# Patient Record
Sex: Male | Born: 1955 | Race: Black or African American | Hispanic: No | Marital: Single | Smoking: Current every day smoker
Health system: Southern US, Community
[De-identification: ages and names within clinical notes are randomized; demographics above are authoritative.]

## PROBLEM LIST (undated history)

## (undated) DIAGNOSIS — E119 Type 2 diabetes mellitus without complications: Secondary | ICD-10-CM

## (undated) DIAGNOSIS — I252 Old myocardial infarction: Secondary | ICD-10-CM

## (undated) HISTORY — PX: CORONARY ANGIOPLASTY WITH STENT PLACEMENT: SHX49

---

## 2016-12-01 ENCOUNTER — Encounter: Payer: Self-pay | Admitting: Emergency Medicine

## 2016-12-01 ENCOUNTER — Emergency Department
Admission: EM | Admit: 2016-12-01 | Discharge: 2016-12-03 | Disposition: A | Discharge status: Other Acute Inpatient Hospital | Payer: Self-pay | Attending: Emergency Medicine | Admitting: Emergency Medicine

## 2016-12-01 ENCOUNTER — Emergency Department
Admission: EM | Admit: 2016-12-01 | Discharge: 2016-12-01 | Disposition: A | Discharge status: Home / Self Care | Payer: Self-pay | Attending: Emergency Medicine | Admitting: Emergency Medicine

## 2016-12-01 ENCOUNTER — Emergency Department: Payer: Self-pay

## 2016-12-01 DIAGNOSIS — R0602 Shortness of breath: Secondary | ICD-10-CM | POA: Insufficient documentation

## 2016-12-01 DIAGNOSIS — Z7982 Long term (current) use of aspirin: Secondary | ICD-10-CM | POA: Insufficient documentation

## 2016-12-01 DIAGNOSIS — Z79899 Other long term (current) drug therapy: Secondary | ICD-10-CM | POA: Insufficient documentation

## 2016-12-01 DIAGNOSIS — F1410 Cocaine abuse, uncomplicated: Secondary | ICD-10-CM

## 2016-12-01 DIAGNOSIS — R079 Chest pain, unspecified: Secondary | ICD-10-CM

## 2016-12-01 DIAGNOSIS — R451 Restlessness and agitation: Secondary | ICD-10-CM | POA: Insufficient documentation

## 2016-12-01 DIAGNOSIS — F17210 Nicotine dependence, cigarettes, uncomplicated: Secondary | ICD-10-CM | POA: Insufficient documentation

## 2016-12-01 DIAGNOSIS — R45851 Suicidal ideations: Secondary | ICD-10-CM

## 2016-12-01 DIAGNOSIS — F602 Antisocial personality disorder: Secondary | ICD-10-CM

## 2016-12-01 DIAGNOSIS — I252 Old myocardial infarction: Secondary | ICD-10-CM | POA: Insufficient documentation

## 2016-12-01 DIAGNOSIS — Z765 Malingerer [conscious simulation]: Secondary | ICD-10-CM

## 2016-12-01 DIAGNOSIS — E119 Type 2 diabetes mellitus without complications: Secondary | ICD-10-CM | POA: Insufficient documentation

## 2016-12-01 DIAGNOSIS — R443 Hallucinations, unspecified: Secondary | ICD-10-CM | POA: Insufficient documentation

## 2016-12-01 DIAGNOSIS — R0789 Other chest pain: Secondary | ICD-10-CM | POA: Insufficient documentation

## 2016-12-01 DIAGNOSIS — Z7984 Long term (current) use of oral hypoglycemic drugs: Secondary | ICD-10-CM | POA: Insufficient documentation

## 2016-12-01 DIAGNOSIS — R110 Nausea: Secondary | ICD-10-CM | POA: Insufficient documentation

## 2016-12-01 HISTORY — DX: Type 2 diabetes mellitus without complications: E11.9

## 2016-12-01 HISTORY — DX: Old myocardial infarction: I25.2

## 2016-12-01 LAB — CBC
HCT: 40 % (ref 40.0–52.0)
Hemoglobin: 13.8 g/dL (ref 13.0–18.0)
MCH: 30.3 pg (ref 26.0–34.0)
MCHC: 34.5 g/dL (ref 32.0–36.0)
MCV: 87.8 fL (ref 80.0–100.0)
PLATELETS: 225 10*3/uL (ref 150–440)
RBC: 4.56 MIL/uL (ref 4.40–5.90)
RDW: 14.8 % — AB (ref 11.5–14.5)
WBC: 6.4 10*3/uL (ref 3.8–10.6)

## 2016-12-01 LAB — COMPREHENSIVE METABOLIC PANEL
ALT: 31 U/L (ref 17–63)
ANION GAP: 8 (ref 5–15)
AST: 32 U/L (ref 15–41)
Albumin: 3.6 g/dL (ref 3.5–5.0)
Alkaline Phosphatase: 46 U/L (ref 38–126)
BUN: 10 mg/dL (ref 6–20)
CHLORIDE: 103 mmol/L (ref 101–111)
CO2: 26 mmol/L (ref 22–32)
Calcium: 9.3 mg/dL (ref 8.9–10.3)
Creatinine, Ser: 0.74 mg/dL (ref 0.61–1.24)
GFR calc non Af Amer: >60 mL/min (ref >60)
Glucose, Bld: 186 mg/dL — ABNORMAL HIGH (ref 65–99)
POTASSIUM: 3.7 mmol/L (ref 3.5–5.1)
SODIUM: 137 mmol/L (ref 135–145)
Total Bilirubin: 0.4 mg/dL (ref 0.3–1.2)
Total Protein: 6.6 g/dL (ref 6.5–8.1)

## 2016-12-01 LAB — URINE DRUG SCREEN, QUALITATIVE (ARMC ONLY)
AMPHETAMINES, UR SCREEN: NOT DETECTED
BENZODIAZEPINE, UR SCRN: POSITIVE — AB
Barbiturates, Ur Screen: NOT DETECTED
Cannabinoid 50 Ng, Ur ~~LOC~~: NOT DETECTED
Cocaine Metabolite,Ur ~~LOC~~: NOT DETECTED
MDMA (Ecstasy)Ur Screen: NOT DETECTED
METHADONE SCREEN, URINE: NOT DETECTED
Opiate, Ur Screen: POSITIVE — AB
Phencyclidine (PCP) Ur S: NOT DETECTED
Tricyclic, Ur Screen: NOT DETECTED

## 2016-12-01 LAB — TROPONIN I: Troponin I: <0.03 ng/mL (ref <0.03)

## 2016-12-01 LAB — GLUCOSE, CAPILLARY: Glucose-Capillary: 164 mg/dL — ABNORMAL HIGH (ref 65–99)

## 2016-12-01 LAB — ETHANOL

## 2016-12-01 MED ORDER — AMLODIPINE BESYLATE 5 MG PO TABS
10.0000 mg | ORAL_TABLET | Freq: Every day | ORAL | Status: DC
  Administered 2016-12-02 – 2016-12-03 (×2): 10 mg via ORAL
  Filled 2016-12-01 (×2): qty 2

## 2016-12-01 MED ORDER — NAPROXEN 500 MG PO TABS
500.0000 mg | ORAL_TABLET | ORAL | Status: AC
  Administered 2016-12-01: 500 mg via ORAL

## 2016-12-01 MED ORDER — BUSPIRONE HCL 5 MG PO TABS
5.0000 mg | ORAL_TABLET | Freq: Two times a day (BID) | ORAL | Status: DC
  Administered 2016-12-01 – 2016-12-03 (×4): 5 mg via ORAL
  Filled 2016-12-01 (×4): qty 1

## 2016-12-01 MED ORDER — ASPIRIN EC 81 MG PO TBEC
81.0000 mg | DELAYED_RELEASE_TABLET | Freq: Every day | ORAL | Status: DC
  Administered 2016-12-02 – 2016-12-03 (×2): 81 mg via ORAL
  Filled 2016-12-01 (×2): qty 1

## 2016-12-01 MED ORDER — NAPROXEN 500 MG PO TABS
ORAL_TABLET | ORAL | Status: AC
  Administered 2016-12-01: 500 mg via ORAL
  Filled 2016-12-01: qty 1

## 2016-12-01 MED ORDER — ONDANSETRON HCL 4 MG/2ML IJ SOLN
4.0000 mg | Freq: Once | INTRAMUSCULAR | Status: AC
  Administered 2016-12-01: 4 mg via INTRAVENOUS
  Filled 2016-12-01: qty 2

## 2016-12-01 MED ORDER — METOPROLOL TARTRATE 25 MG PO TABS
50.0000 mg | ORAL_TABLET | Freq: Two times a day (BID) | ORAL | Status: DC
  Administered 2016-12-01 – 2016-12-03 (×4): 50 mg via ORAL
  Filled 2016-12-01: qty 1
  Filled 2016-12-01: qty 2
  Filled 2016-12-01: qty 1
  Filled 2016-12-01: qty 2

## 2016-12-01 MED ORDER — NITROGLYCERIN 0.4 MG SL SUBL
0.4000 mg | SUBLINGUAL_TABLET | Freq: Once | SUBLINGUAL | Status: AC
  Administered 2016-12-01: 0.4 mg via SUBLINGUAL
  Filled 2016-12-01: qty 1

## 2016-12-01 MED ORDER — LORAZEPAM 2 MG PO TABS: 0.0000 mg | ORAL_TABLET | Freq: Four times a day (QID) | ORAL | Status: AC

## 2016-12-01 MED ORDER — METFORMIN HCL ER 500 MG PO TB24
500.0000 mg | ORAL_TABLET | Freq: Every day | ORAL | Status: DC
  Administered 2016-12-02 – 2016-12-03 (×2): 500 mg via ORAL
  Filled 2016-12-01 (×2): qty 1

## 2016-12-01 MED ORDER — HYDROCHLOROTHIAZIDE 25 MG PO TABS
25.0000 mg | ORAL_TABLET | Freq: Every day | ORAL | Status: DC
  Administered 2016-12-02 – 2016-12-03 (×2): 25 mg via ORAL
  Filled 2016-12-01 (×2): qty 1

## 2016-12-01 MED ORDER — MORPHINE SULFATE (PF) 4 MG/ML IV SOLN
4.0000 mg | Freq: Once | INTRAVENOUS | Status: AC
  Administered 2016-12-01: 4 mg via INTRAVENOUS
  Filled 2016-12-01: qty 1

## 2016-12-01 MED ORDER — VITAMIN B-1 100 MG PO TABS
100.0000 mg | ORAL_TABLET | Freq: Every day | ORAL | Status: DC
  Administered 2016-12-01 – 2016-12-03 (×3): 100 mg via ORAL
  Filled 2016-12-01 (×3): qty 1

## 2016-12-01 MED ORDER — TRAZODONE HCL 50 MG PO TABS
150.0000 mg | ORAL_TABLET | Freq: Every day | ORAL | Status: DC
  Administered 2016-12-01 – 2016-12-02 (×2): 150 mg via ORAL
  Filled 2016-12-01 (×2): qty 1

## 2016-12-01 MED ORDER — NAPROXEN 250 MG PO TABS
500.0000 mg | ORAL_TABLET | Freq: Two times a day (BID) | ORAL | Status: DC
  Administered 2016-12-02 – 2016-12-03 (×3): 500 mg via ORAL
  Filled 2016-12-01: qty 2
  Filled 2016-12-01: qty 1

## 2016-12-01 MED ORDER — LORAZEPAM 2 MG PO TABS: 0.0000 mg | ORAL_TABLET | Freq: Two times a day (BID) | ORAL | Status: DC

## 2016-12-01 MED ORDER — THIAMINE HCL 100 MG/ML IJ SOLN: 100.0000 mg | Freq: Every day | INTRAMUSCULAR | Status: DC

## 2016-12-01 MED ORDER — ATORVASTATIN CALCIUM 20 MG PO TABS
80.0000 mg | ORAL_TABLET | Freq: Every day | ORAL | Status: DC
  Administered 2016-12-01 – 2016-12-03 (×3): 80 mg via ORAL
  Filled 2016-12-01 (×4): qty 4

## 2016-12-01 NOTE — ED Notes (Signed)
Gave pt two cokes.

## 2016-12-01 NOTE — ED Notes (Signed)
Snack given.

## 2016-12-01 NOTE — Clinical Social Work Note (Addendum)
CSW contacted by ED physician stating that patient is needing a ride to [place]. CSW was informed by MD that [facility] and that patient presented to our ED from [facility]. Patient has been medically cleared and is ready for discharge. Patient is refusing to return to [facility] stating he has "some business to handle." CSW spoke with patient regarding the fact that we will not be able to furnish him with transportation to [place] and that we could only possibly assist with local transportation. Patient began cursing and getting agitated and demanding a ride to [place]. Patient stated that the hospital in [place] was able to get him a ride to [facility] so why couldn't we get him a ride to [place]. CSW explained that we would not be providing a ride for him to [place]. CSW staffed case with CSW Director and patient will get a taxi voucher to the [employer].  CSW went to ED to inform patient of this and patient had left. Patient's nurse stated he stated he had somewhere he needed to be and that he was not going to wait. Patient's nurse informed CSW that he called the [facility] in [place] that had sent him to [facility] and that they were going to arrange for a taxi for him to go to [place].   York SpanielMonica Bao Coreas MSW,LCSW [phone]

## 2016-12-01 NOTE — ED Notes (Signed)
First nurse note   States he is requesting etoh detox  But also hearing voices and wants to hurt self

## 2016-12-01 NOTE — ED Notes (Signed)
MD verbalized pt could have 650mg  of tylenol. PT was informed and pt refused tylenol at this time. Pt going to sleep and reports he will inform nurse if pain increases.

## 2016-12-01 NOTE — Consult Note (Signed)
[facility] Face-to-Face Psychiatry Consult   Reason for Consult:  Consult for 61 year old man who is in the emergency room insisting that he is suicidal Referring Physician:  Pershing Proud Patient Identification: Victor Herring MRN:  [id number] Principal Diagnosis: Malingering Diagnosis:   Patient Active Problem List   Diagnosis Date Noted  . Cocaine abuse [F14.10] 12/01/2016  . Malingering [Z76.5] 12/01/2016  . Antisocial personality disorder [F60.2] 12/01/2016    Total Time spent with patient: 1 hour  Subjective:   Victor Herring is a 61 y.o. male patient admitted with "I'm suicidal, I'm hearing voices, and I'm going to kill somebody".  HPI:  Patient interviewed chart reviewed. Old notes from outside hospitals reviewed. 61 year old man was in the emergency room today for chest pain. He had apparently come to residential treatment services from [place] just yesterday but then impulsively left there and came to the emergency room. When his medical problems here were cleared he was discharged to the waiting area. He was asking if he could be given a free ticket to take him on the bus back to [place]. When he was told that this could not be provided the patient announced that he had suddenly begun to have auditory hallucinations and was thinking of killing himself or killing someone else. When I spoke to him this evening he repeated all of those things. He says that people are playing "mind games" with him. His description of mind games is when people won't give him what he wants and when he wants it. He says that he was asking for a bus ticket and was told it would not be provided which she considers to be an inappropriate response. He claims he has never had these symptoms before which is contradicted by the chart. He's had an admission just about a year ago and [place] [facility] pretty much exactly the same situation and was diagnosed with malingering at that time. Patient admits that he's been abusing cocaine  which is his primary drug of choice and is the reason that he is here but also has been abusing alcohol. Claims that he drinks 740 ounces per day.  Social history: Originally from [place] he has been in [place] for a few years mostly in [place]. Says that he has a place to stay only when his girlfriend is not acting crazy.  Medical history: Does have a history of myocardial infarction and coronary artery disease  Substance abuse history: History of alcohol and cocaine abuse  Past Psychiatric History: Patient claimed to me that he had never been treated for any mental health problems before. That appears to be not the case he was in the hospital with similar complaints and year ago. Denies any past history of suicide attempts. Denies any past psychiatric medicine.  Risk to Self: Is patient at risk for suicide?: No Risk to Others:   Prior Inpatient Therapy:   Prior Outpatient Therapy:    Past Medical History:  Past Medical History:  Diagnosis Date  . Diabetes mellitus without complication (HCC)   . MI, old     Past Surgical History:  Procedure Laterality Date  . CORONARY ANGIOPLASTY WITH STENT PLACEMENT     Family History: No family history on file. Family Psychiatric  History: Denies knowing of any family history Social History:  History  Alcohol Use  . Yes    Comment: 6-8 40oz per day     History  Drug use: Unknown    Social History   Social History  .  Marital status: Single    Spouse name: N/A  . Number of children: N/A  . Years of education: N/A   Social History Main Topics  . Smoking status: Current Every Day Smoker    Packs/day: 0.10    Types: Cigarettes  . Smokeless tobacco: None  . Alcohol use Yes     Comment: 6-8 40oz per day  . Drug use: Unknown  . Sexual activity: Not Asked   Other Topics Concern  . None   Social History Narrative  . None   Additional Social History:    Allergies:   Allergies  Allergen Reactions  . Tape  Itching  . Latex Rash  . Motrin [Ibuprofen] Rash  . Tylenol [Acetaminophen] Rash    Labs:  Results for orders placed or performed during the hospital encounter of 12/01/16 (from the past 48 hour(s))  Ethanol     Status: None   Collection Time: 12/01/16  2:51 PM  Result Value Ref Range   Alcohol, Ethyl (B) <5 <5 mg/dL    Comment:        LOWEST DETECTABLE LIMIT FOR SERUM ALCOHOL IS 5 mg/dL FOR MEDICAL PURPOSES ONLY   Urine Drug Screen, Qualitative ([facility] only)     Status: Abnormal   Collection Time: 12/01/16  2:51 PM  Result Value Ref Range   Tricyclic, Ur Screen NONE DETECTED NONE DETECTED   Amphetamines, Ur Screen NONE DETECTED NONE DETECTED   MDMA (Ecstasy)Ur Screen NONE DETECTED NONE DETECTED   Cocaine Metabolite,Ur [place] NONE DETECTED NONE DETECTED   Opiate, Ur Screen POSITIVE (A) NONE DETECTED   Phencyclidine (PCP) Ur S NONE DETECTED NONE DETECTED   Cannabinoid 50 Ng, Ur [place] NONE DETECTED NONE DETECTED   Barbiturates, Ur Screen NONE DETECTED NONE DETECTED   Benzodiazepine, Ur Scrn POSITIVE (A) NONE DETECTED   Methadone Scn, Ur NONE DETECTED NONE DETECTED    Comment: (NOTE) 100  Tricyclics, urine               Cutoff 1000 ng/mL 200  Amphetamines, urine             Cutoff 1000 ng/mL 300  MDMA (Ecstasy), urine           Cutoff 500 ng/mL 400  Cocaine Metabolite, urine       Cutoff 300 ng/mL 500  Opiate, urine                   Cutoff 300 ng/mL 600  Phencyclidine (PCP), urine      Cutoff 25 ng/mL 700  Cannabinoid, urine              Cutoff 50 ng/mL 800  Barbiturates, urine             Cutoff 200 ng/mL 900  Benzodiazepine, urine           Cutoff 200 ng/mL 1000 Methadone, urine                Cutoff 300 ng/mL 1100 1200 The urine drug screen provides only a preliminary, unconfirmed 1300 analytical test result and should not be used for non-medical 1400 purposes. Clinical consideration and professional judgment should 1500 be applied to any positive drug screen result due to  possible 1600 interfering substances. A more specific alternate chemical method 1700 must be used in order to obtain a confirmed analytical result.  1800 Gas chromato graphy / mass spectrometry (GC/MS) is the preferred 1900 confirmatory method.     Current Facility-Administered Medications  Medication  Dose Route Frequency Provider Last Rate Last Dose  . LORazepam (ATIVAN) tablet 0-4 mg  0-4 mg Oral Q6H Myrna Blazer, MD       Followed by  . [START ON 12/03/2016] LORazepam (ATIVAN) tablet 0-4 mg  0-4 mg Oral Q12H Myrna Blazer, MD      . thiamine (VITAMIN B-1) tablet 100 mg  100 mg Oral Daily Myrna Blazer, MD       Or  . thiamine (B-1) injection 100 mg  100 mg Intravenous Daily Myrna Blazer, MD       Current Outpatient Prescriptions  Medication Sig Dispense Refill  . amLODipine (NORVASC) 10 MG tablet Take 10 mg by mouth daily.    Marland Kitchen aspirin EC 81 MG tablet Take 81 mg by mouth daily.    Marland Kitchen atorvastatin (LIPITOR) 80 MG tablet Take 80 mg by mouth daily.    . busPIRone (BUSPAR) 5 MG tablet Take 5 mg by mouth 2 (two) times daily.    . hydrochlorothiazide (HYDRODIURIL) 25 MG tablet Take 25 mg by mouth daily.    . metFORMIN (GLUCOPHAGE-XR) 500 MG 24 hr tablet Take 500 mg by mouth daily with breakfast.    . metoprolol (LOPRESSOR) 50 MG tablet Take 50 mg by mouth 2 (two) times daily.    . naproxen (NAPROSYN) 250 MG tablet Take 500 mg by mouth 2 (two) times daily with a meal.    . traZODone (DESYREL) 150 MG tablet Take 150 mg by mouth at bedtime.      Musculoskeletal: Strength & Muscle Tone: within normal limits Gait & Station: normal Patient leans: N/A  Psychiatric Specialty Exam: Physical Exam  Nursing note and vitals reviewed. Constitutional: He appears well-developed and well-nourished.  HENT:  Head: Normocephalic and atraumatic.  Eyes: Conjunctivae are normal. Pupils are equal, round, and reactive to light.  Neck: Normal range of motion.   Cardiovascular: Regular rhythm and normal heart sounds.   Respiratory: Effort normal. No respiratory distress.  GI: Soft.  Musculoskeletal: Normal range of motion.  Neurological: He is alert.  Skin: Skin is warm and dry.  Psychiatric: His speech is normal and behavior is normal. His affect is angry, labile and inappropriate. He expresses impulsivity and inappropriate judgment. He expresses homicidal and suicidal ideation. He exhibits abnormal recent memory and abnormal remote memory.    Review of Systems  Constitutional: Negative.   HENT: Negative.   Eyes: Negative.   Respiratory: Negative.   Cardiovascular: Negative.   Gastrointestinal: Negative.   Musculoskeletal: Negative.   Skin: Negative.   Neurological: Negative.   Psychiatric/Behavioral: Positive for depression, hallucinations, substance abuse and suicidal ideas. Negative for memory loss. The patient is nervous/anxious and has insomnia.     Blood pressure 135/83, pulse 80, temperature 97.4 F (36.3 C), temperature source Oral, resp. rate 18, height 5\' 11"  (1.803 m), weight 104.3 kg (230 lb), SpO2 96 %.Body mass index is 32.08 kg/m.  General Appearance: Fairly Groomed  Eye Contact:  Fair  Speech:  Clear and Coherent  Volume:  Normal  Mood:  Irritable  Affect:  Congruent  Thought Process:  Goal Directed  Orientation:  Full (Time, Place, and Person)  Thought Content:  Tangential  Suicidal Thoughts:  Yes.  with intent/plan  Homicidal Thoughts:  Yes.  with intent/plan  Memory:  Immediate;   Fair Recent;   Fair Remote;   Fair  Judgement:  Impaired  Insight:  Shallow  Psychomotor Activity:  Decreased  Concentration:  Concentration: Fair  Recall:  Fair  [employer] of Knowledge:  Fair  Language:  Fair  Akathisia:  No  Handed:  Right  AIMS (if indicated):     Assets:  Communication Skills  ADL's:  Intact  Cognition:  WNL  Sleep:        Treatment Plan Summary: Plan 61 year old man presents as irritable. One could call  this an adjustment disorder if it warrants so clearly intended to help him to avoid going back to our [facility] and being outdoors in the cold. Patient does not require hospitalization but is not currently safe for discharge given that he would almost certainly immediately act out again to come back in the emergency room. No change to commitment papers. Case reviewed with [facility] and emergency room physician. Patient can be reevaluated tomorrow.  Disposition: Supportive therapy provided about ongoing stressors.  Mordecai Rasmussen, MD 12/01/2016 6:38 PM

## 2016-12-01 NOTE — ED Notes (Signed)
PAtient in hallway at this time. Pt observed by RN, Tech, and security

## 2016-12-01 NOTE — ED Notes (Signed)
Pt removed watch. Watch added to belongings.

## 2016-12-01 NOTE — Discharge Instructions (Signed)
You have been seen in the emergency department today for chest pain. Your workup has shown normal results. As we discussed please follow-up with your primary care physician in the next 1-2 days for recheck. Return to the emergency department for any further chest pain, trouble breathing, or any other symptom personally concerning to yourself. °

## 2016-12-01 NOTE — ED Triage Notes (Signed)
Patient was just discharged to lobby. States while in lobby began hearing voices telling him to hurt himself. Denies history of same.

## 2016-12-01 NOTE — ED Provider Notes (Addendum)
[facility] Emergency Department Provider Note  Time seen: 8:17 AM  I have reviewed the triage vital signs and the nursing notes.   HISTORY  Chief Complaint Chest Pain    HPI Victor Herring is a 61 y.o. male with a past medical history of diabetes, MI 3 months ago with stent placement who presents to the emergency department with chest pain. According to the patient he awoke with chest discomfort this morning and some radiation to left arm. Patient also states shortness of breath and mild nausea denies diaphoresis. Patient continues to state moderate chest pain at this time which he describes as a pressure to the center of his chest.  Past Medical History:  Diagnosis Date  . Diabetes mellitus without complication (HCC)   . MI, old     There are no active problems to display for this patient.   Past Surgical History:  Procedure Laterality Date  . CORONARY ANGIOPLASTY WITH STENT PLACEMENT      Prior to Admission medications   Not on File    Allergies  Allergen Reactions  . Motrin [Ibuprofen] Rash  . Tylenol [Acetaminophen] Rash    No family history on file.  Social History Social History  Substance Use Topics  . Smoking status: Current Every Day Smoker    Packs/day: 0.10    Types: Cigarettes  . Smokeless tobacco: Not on file  . Alcohol use Yes     Comment: 6-8 40oz per day    Review of Systems Constitutional: Negative for fever. Cardiovascular: Moderate chest pain/pressure. Respiratory: Mild shortness of breath Gastrointestinal: Negative for abdominal pain. Positive for nausea Neurological: Negative for headache 10-point ROS otherwise negative.  ____________________________________________   PHYSICAL EXAM:  VITAL SIGNS: ED Triage Vitals  Enc Vitals Group     BP 12/01/16 0747 132/79     Pulse Rate 12/01/16 0747 74     Resp 12/01/16 0747 18     Temp 12/01/16 0747 97.4 F (36.3 C)     Temp Source 12/01/16 0747 Oral     SpO2  12/01/16 0747 96 %     Weight 12/01/16 0748 230 lb (104.3 kg)     Height 12/01/16 0748 5\' 11"  (1.803 m)     Head Circumference --      Peak Flow --      Pain Score 12/01/16 0748 10     Pain Loc --      Pain Edu? --      Excl. in GC? --     Constitutional: Alert and oriented. Well appearing and in no distress. Eyes: Normal exam ENT   Head: Normocephalic and atraumatic.   Mouth/Throat: Mucous membranes are moist. Cardiovascular: Normal rate, regular rhythm. No murmur Respiratory: Normal respiratory effort without tachypnea nor retractions. Breath sounds are clear  Gastrointestinal: Soft and nontender. No distention.   Musculoskeletal: Nontender with normal range of motion in all extremities. No lower extremity tenderness or edema. Neurologic:  Normal speech and language. No gross focal neurologic deficits Skin:  Skin is warm, dry and intact.  Psychiatric: Mood and affect are normal.   ____________________________________________    EKG  EKG reviewed and interpreted by myself shows normal sinus rhythm at 76 bpm, slightly widened QRS, left axis deviation, nonspecific ST changes.  Repeat EKG reviewed and interpreted by myself 07:59:36 shows normal sinus rhythm at 74 bpm, slightly widened QRS, left axis deviation, nonspecific ST changes. Does appear to be a slight ST elevation in lead V2 however there is  no reciprocal changes and no other ST elevation noted. V2 appears to have a pointing most consistent with early repolarization.  ____________________________________________    RADIOLOGY   IMPRESSION: Right base subsegmental atelectasis.  Central pulmonary vascular prominence without pulmonary edema.  Heart size top-normal.  Slightly sclerotic appearance of the humeral heads bilaterally. Cannot exclude bone infarct or avascular necrosis.  ____________________________________________   INITIAL IMPRESSION / ASSESSMENT AND PLAN / ED COURSE  Pertinent labs & imaging  results that were available during my care of the patient were reviewed by me and considered in my medical decision making (see chart for details).  Patient presents to the emergency department with chest pain beginning this morning. Patient states a history of a myocardial infarction with a stent placed in December 2017. Currently the patient appears well, no distress. We will check labs and closely monitor in the emergency department. We will try nitroglycerin as well as morphine for discomfort relief.  Patient now states he was actually sent to residential treatment services from [place] for alcohol detox. States he did not like [facility] which is why he called EMS for chest pain this morning to bring him here. Patient now states he has no way to get back to [place] and is requesting social work consult. Social worker has seen the patient states that we are not able to provide transportation back to [place] but would be happy to provide transportation back to [facility]. Patient became very upset and disconnected his leads and states he is leaving.  Patient was willing to stay for a second troponin which is negative. We will discharge the patient home, again offered [facility] and the patient refused.   ____________________________________________   FINAL CLINICAL IMPRESSION(S) / ED DIAGNOSES  Chest pain    Minna AntisKevin Konnar Ben, MD 12/01/16 1102    Minna AntisKevin Thi Sisemore, MD 12/01/16 [id number]

## 2016-12-01 NOTE — ED Notes (Signed)
Gave food tray. 

## 2016-12-01 NOTE — ED Notes (Signed)
EDMD at bedside

## 2016-12-01 NOTE — BH Assessment (Signed)
Assessment Note  Victor Herring is an 61 y.o. male who presents to the ER for the second time within 24 hours. With this visit, patient reports of having command hallucinations with instructions to end his life. Patient states he does not have a reason to live and will follow through with them. He began to voiced the A/H, while waiting in the ER lobby, after he was discharged the first time.  [place] and [place] in [place] with his girlfriend. He was "kicked out" of the home. It's unclear if he was asked to leave prior to him going to [facility] or after he lifted their program. Patient admits to abusing alcohol and that is why he was admitted with [facility].  According to [facility] [phone]), he called 911 and when EMS arrived, he told them they could not communicate with [facility]. [facility] staff was unaware of the phone call. They found out when EMS arrived. Per their director, the patient will not be allowed back into their program.  Diagnosis: Depression  Past Medical History:  Past Medical History:  Diagnosis Date  . Diabetes mellitus without complication (HCC)   . MI, old     Past Surgical History:  Procedure Laterality Date  . CORONARY ANGIOPLASTY WITH STENT PLACEMENT      Family History: No family history on file.  Social History:  reports that he has been smoking Cigarettes.  He has been smoking about 0.10 packs per day. He does not have any smokeless tobacco history on file. He reports that he drinks alcohol. His drug history is not on file.  Additional Social History:  Alcohol / Drug Use Pain Medications: See PTA Prescriptions: See PTA Over the Counter: See PTA History of alcohol / drug use?: Yes Longest period of sobriety (when/how long): Two Years Negative Consequences of Use: Work / Programmer, multimediachool, Copywriter, advertisingersonal relationships, Surveyor, quantityinancial Withdrawal Symptoms: Sweats, Tremors, Nausea / Vomiting Substance #1 Name of Substance 1: Alcohol 1 - Age of First Use:  Unable to quantify  1 - Amount (size/oz): 7 to 8, beers, size is unknown  1 - Frequency: Daily 1 - Duration: Unable to quantify  1 - Last Use / Amount: "Several days ago."   CIWA: CIWA-Ar BP: 135/83 Pulse Rate: 80 Nausea and Vomiting: no nausea and no vomiting Tactile Disturbances: none Tremor: no tremor Auditory Disturbances: not present Paroxysmal Sweats: no sweat visible Visual Disturbances: not present Anxiety: no anxiety, at ease Headache, Fullness in Head: none present Agitation: normal activity Orientation and Clouding of Sensorium: oriented and can do serial additions CIWA-Ar Total: 0 COWS:    Allergies:  Allergies  Allergen Reactions  . Tape Itching  . Latex Rash  . Motrin [Ibuprofen] Rash  . Tylenol [Acetaminophen] Rash    Home Medications:  (Not in a hospital admission)  OB/GYN Status:  No LMP for male patient.  General Assessment Data [facility] Assessment: In system Is this a Tele or Face-to-Face Assessment?: Face-to-Face Is this an Initial Assessment or a Re-assessment for this encounter?: Initial Assessment Marital status: Long term relationship Maiden name: n/a Is patient pregnant?: Unknown Pregnancy Status: No Living Arrangements: Other (Comment) (Currently Homeless ) Can pt return to current living arrangement?: Yes Admission Status: Involuntary Is patient capable of signing voluntary admission?: No Referral Source: Self/Family/Friend Insurance type: None  Medical Screening Exam [facility] ONLY) Medical Exam completed: Yes  Crisis Care Plan Living Arrangements: Other (Comment) (Currently Homeless ) Legal Guardian: Other: (Self)  Name of Psychiatrist: Reports of none Name of Therapist: Reports of none  Education Status Is patient currently in school?: No Current Grade: n/a Highest grade of school patient has completed: n/a Name of school: n/a Contact person: n/a  Risk to self with the past 6  months Suicidal Ideation: Yes-Currently Present Has patient been a risk to self within the past 6 months prior to admission? : Yes Suicidal Intent: No Has patient had any suicidal intent within the past 6 months prior to admission? : No Is patient at risk for suicide?: No Suicidal Plan?: Yes-Currently Present Has patient had any suicidal plan within the past 6 months prior to admission? : Yes Specify Current Suicidal Plan: Overdose or walk into traffic Access to Means: Yes Specify Access to Suicidal Means: Medications and traffic What has been your use of drugs/alcohol within the last 12 months?: Alcohol Previous Attempts/Gestures: No How many times?: 0 Other Self Harm Risks: Active Addiction Triggers for Past Attempts: None known Intentional Self Injurious Behavior: None Family Suicide History: No Recent stressful life event(s): Other (Comment) (Homeless and Active Addiction ) Persecutory voices/beliefs?: No Depression: Yes Depression Symptoms: Feeling worthless/self pity, Loss of interest in usual pleasures, Isolating, Fatigue Substance abuse history and/or treatment for substance abuse?: Yes Suicide prevention information given to non-admitted patients: Not applicable  Risk to Others within the past 6 months Homicidal Ideation: No Does patient have any lifetime risk of violence toward others beyond the six months prior to admission? : No Thoughts of Harm to Others: No Current Homicidal Intent: No Current Homicidal Plan: No Access to Homicidal Means: No Identified Victim: Reports of none History of harm to others?: No Violent Behavior Description: Reports of none Does patient have access to weapons?: No Criminal Charges Pending?: No Does patient have a court date: No Is patient on probation?: No  Psychosis Hallucinations: Auditory, With command Delusions: None noted  Mental Status Report Appearance/Hygiene: In scrubs, Unremarkable Eye Contact: Fair Motor Activity:  Freedom of movement, Unremarkable Speech: Logical/coherent, Unremarkable Level of Consciousness: Alert Mood: Depressed, Pleasant Affect: Appropriate to circumstance, Depressed Anxiety Level: None Thought Processes: Coherent, Relevant Judgement: Unimpaired Orientation: Person, Place, Time, Situation, Appropriate for developmental age Obsessive Compulsive Thoughts/Behaviors: Minimal  Cognitive Functioning Concentration: Normal Memory: Recent Intact, Remote Intact IQ: Average Insight: Fair Impulse Control: Fair Appetite: Good Weight Loss: 0 Weight Gain: 0 Sleep: No Change Total Hours of Sleep: 8 Vegetative Symptoms: None  ADLScreening [facility] Assessment Services) Patient's cognitive ability adequate to safely complete daily activities?: Yes Patient able to express need for assistance with ADLs?: Yes Independently performs ADLs?: Yes (appropriate for developmental age)  Prior Inpatient Therapy Prior Inpatient Therapy: Yes Prior Therapy Dates: Patient unable to remember dates Prior Therapy Facilty/Provider(s): Patient unable to remember name (Was in [place] area) Reason for Treatment: Depression and substance use  Prior Outpatient Therapy Prior Outpatient Therapy: No Prior Therapy Dates: Reports of none Prior Therapy Facilty/Provider(s): Reports of none Reason for Treatment: Reports of none Does patient have an ACCT team?: No Does patient have Intensive In-House Services?  : No Does patient have [facility] services? : No Does patient have P4CC services?: No  ADL Screening (condition at time of admission) Patient's cognitive ability adequate to safely complete daily activities?: Yes Is the patient deaf or have difficulty hearing?: No Does the patient have difficulty seeing, even when wearing glasses/contacts?: No Does the patient have difficulty concentrating, remembering, or making decisions?: No Patient able to express need for assistance with ADLs?: Yes Does  the patient have difficulty dressing or bathing?: No Independently performs ADLs?: Yes (appropriate for developmental age) Does the patient have difficulty walking or climbing stairs?: No Weakness of Legs: None Weakness of Arms/Hands: None  Home Assistive Devices/Equipment Home Assistive Devices/Equipment: None  Therapy Consults (therapy consults require a physician order) PT Evaluation Needed: No OT Evalulation Needed: No SLP Evaluation Needed: No Abuse/Neglect Assessment (Assessment to be complete while patient is alone) Physical Abuse: Denies Verbal Abuse: Denies Sexual Abuse: Denies Exploitation of patient/patient's resources: Denies Self-Neglect: Denies Values / Beliefs Cultural Requests During Hospitalization: None Spiritual Requests During Hospitalization: None Consults Spiritual Care Consult Needed: No Social Work Consult Needed: No Merchant navy officer (For Healthcare) Does Patient Have a Medical Advance Directive?: No Would patient like information on creating a medical advance directive?: No - Patient declined    Additional Information 1:1 In Past 12 Months?: No CIRT Risk: No Elopement Risk: No Does patient have medical clearance?: Yes  Child/Adolescent Assessment Running Away Risk: Denies (Patient is an adult)  Disposition:  Disposition Initial Assessment Completed for this Encounter: Yes Disposition of Patient: Other dispositions (ER MD ordered Psych Consult)  On Site Evaluation by:   Reviewed with Physician:    Lilyan Gilford MS, LCAS, LPC, NCC, CCSI Therapeutic Triage Specialist 12/01/2016 8:27 PM

## 2016-12-01 NOTE — ED Notes (Signed)
Hooked pt up to monitor.

## 2016-12-01 NOTE — ED Triage Notes (Signed)
Awoke with sharp chest pain this am. States is sharp pain L chest.Worse when inpires or moves.

## 2016-12-01 NOTE — ED Provider Notes (Signed)
[facility] Emergency Department Provider Note  ____________________________________________   First MD Initiated Contact with Patient 12/01/16 1507     (approximate)  I have reviewed the triage vital signs and the nursing notes.   HISTORY  Chief Complaint Medical Clearance   HPI Victor Herring is a 61 y.o. male with a history of diabetes and myocardial infarction was presenting to the emergency department today with suicidal ideation. He was seen earlier in the day due to chest pain. Briefly, the patient was receiving Alkol treatment [facility] and was transferred to the emergency department for chest pain. He was medically cleared and then discharged back to [facility]. However, the triage note here says that he began experiencing hallucinations and suicidal ideation in the emergency department. He reports to me that he is "going to go outside and fucking freeze to death."  He also reported to me that he hadn't alternative plan to jump off a bridge. Patient said that he last had alcohol yesterday and is originally from [place] but was transferred here for treatment at [facility]. Says that he has never had hallucinations such as this before.   Past Medical History:  Diagnosis Date  . Diabetes mellitus without complication (HCC)   . MI, old     There are no active problems to display for this patient.   Past Surgical History:  Procedure Laterality Date  . CORONARY ANGIOPLASTY WITH STENT PLACEMENT      Prior to Admission medications   Medication Sig Start Date End Date Taking? Authorizing Provider  amLODipine (NORVASC) 10 MG tablet Take 10 mg by mouth daily.   Yes Historical Provider, MD  aspirin EC 81 MG tablet Take 81 mg by mouth daily.   Yes Historical Provider, MD  atorvastatin (LIPITOR) 80 MG tablet Take 80 mg by mouth daily.   Yes Historical Provider, MD  busPIRone (BUSPAR) 5 MG tablet Take 5 mg by mouth 2 (two) times daily.   Yes Historical Provider, MD    hydrochlorothiazide (HYDRODIURIL) 25 MG tablet Take 25 mg by mouth daily.   Yes Historical Provider, MD  metFORMIN (GLUCOPHAGE-XR) 500 MG 24 hr tablet Take 500 mg by mouth daily with breakfast.   Yes Historical Provider, MD  metoprolol (LOPRESSOR) 50 MG tablet Take 50 mg by mouth 2 (two) times daily.   Yes Historical Provider, MD  naproxen (NAPROSYN) 250 MG tablet Take 500 mg by mouth 2 (two) times daily with a meal.   Yes Historical Provider, MD  traZODone (DESYREL) 150 MG tablet Take 150 mg by mouth at bedtime.   Yes Historical Provider, MD    Allergies Tape; Latex; Motrin [ibuprofen]; and Tylenol [acetaminophen]  No family history on file.  Social History Social History  Substance Use Topics  . Smoking status: Current Every Day Smoker    Packs/day: 0.10    Types: Cigarettes  . Smokeless tobacco: Not on file  . Alcohol use Yes     Comment: 6-8 40oz per day    Review of Systems Constitutional: No fever/chills Eyes: No visual changes. ENT: No sore throat. Cardiovascular: Denies chest pain. Respiratory: Denies shortness of breath. Gastrointestinal: No abdominal pain.  No nausea, no vomiting.  No diarrhea.  No constipation. Genitourinary: Negative for dysuria. Musculoskeletal: Negative for back pain. Skin: Negative for rash. Neurological: Negative for headaches, focal weakness or numbness.  10-point ROS otherwise negative.  ____________________________________________   PHYSICAL EXAM:  VITAL SIGNS: ED Triage Vitals [12/01/16 1421]  Enc Vitals Group  BP 135/83     Pulse Rate 80     Resp 18     Temp 97.4 F (36.3 C)     Temp Source Oral     SpO2 96 %     Weight 230 lb (104.3 kg)     Height 5\' 11"  (1.803 m)     Head Circumference      Peak Flow      Pain Score      Pain Loc      Pain Edu?      Excl. in GC?     Constitutional: Alert and oriented. Well appearing and in no acute distress. Eyes: Conjunctivae are normal. PERRL. EOMI. Head: Atraumatic. Nose:  No congestion/rhinnorhea. Mouth/Throat: Mucous membranes are moist.  Neck: No stridor.   Cardiovascular: Normal rate, regular rhythm. Grossly normal heart sounds.  Respiratory: Normal respiratory effort.  No retractions. Lungs CTAB. Gastrointestinal: Soft and nontender. No distention.  Musculoskeletal: No lower extremity tenderness nor edema.  No joint effusions. Neurologic:  Normal speech and language. No gross focal neurologic deficits are appreciated. No tremulousness. Skin:  Skin is warm, dry and intact. No rash noted. Psychiatric: Agitated. States on the stretcher but curses during her discussion. Does not appear to be responding to any internal stimuli during our discussion.  ____________________________________________   LABS (all labs ordered are listed, but only abnormal results are displayed)  Labs Reviewed  URINE DRUG SCREEN, QUALITATIVE ([facility] ONLY) - Abnormal; Notable for the following:       Result Value   Opiate, Ur Screen POSITIVE (*)    Benzodiazepine, Ur Scrn POSITIVE (*)    All other components within normal limits  ETHANOL   ____________________________________________  EKG   ____________________________________________  RADIOLOGY   ____________________________________________   PROCEDURES  Procedure(s) performed:   Procedures  Critical Care performed:   ____________________________________________   INITIAL IMPRESSION / ASSESSMENT AND PLAN / ED COURSE  Pertinent labs & imaging results that were available during my care of the patient were reviewed by me and considered in my medical decision making (see chart for details).  Patient aware of need for involuntary commitment paperwork and also need to be evaluated by psychiatry.    Patient placed on CIWA protocol.  ____________________________________________   FINAL CLINICAL IMPRESSION(S) / ED DIAGNOSES  Agitation. Suicidal ideation. Hallucinations.    NEW MEDICATIONS STARTED DURING  THIS VISIT:  New Prescriptions   No medications on file     Note:  This document was prepared using Dragon voice recognition software and may include unintentional dictation errors.    Myrna Blazeravid Matthew Schaevitz, MD 12/01/16 [phone]

## 2016-12-01 NOTE — ED Notes (Signed)
RN called pharmacy to request BUSPAR.

## 2016-12-01 NOTE — ED Notes (Signed)
Gave pt warm blanket. 

## 2016-12-01 NOTE — ED Notes (Signed)
PT IVC PENDING CONSULT  

## 2016-12-02 NOTE — ED Notes (Signed)
PT  MOVED   TO  [facility] SEEN  BY  DR  CLAPACS  TODAY  STILL  PENDING  PLACEMENT

## 2016-12-02 NOTE — ED Notes (Signed)
Patient is sitting in dayroom watching tv, no signs of distress.

## 2016-12-02 NOTE — ED Notes (Signed)
Pt sleeping. Breakfast tray placed at bedside 

## 2016-12-02 NOTE — ED Notes (Signed)
Patient ate 100% of supper, Patient without any behavioral issues, states that He still feels si no hi but hears voices saying " Don't leave" Patient with q 15 minute checks and camera surveillance in progress.

## 2016-12-02 NOTE — ED Notes (Signed)
Pt given lunch tray. Pt stated if any extras he wanted one.

## 2016-12-02 NOTE — ED Notes (Signed)
Pt requesting additional breakfast tray. No extra trays at this time.

## 2016-12-02 NOTE — Consult Note (Signed)
[facility] Face-to-Face Psychiatry Consult   Reason for Consult:  Consult for 61 year old man who is in the emergency room insisting that he is suicidal Referring Physician:  Pershing Proud Patient Identification: Kiko Ripp MRN:  [id number] Principal Diagnosis: Malingering Diagnosis:   Patient Active Problem List   Diagnosis Date Noted  . Cocaine abuse [F14.10] 12/01/2016  . Malingering [Z76.5] 12/01/2016  . Antisocial personality disorder [F60.2] 12/01/2016    Total Time spent with patient: 20 minutes  Subjective:   Maveryck Bahri is a 61 y.o. male patient admitted with"Am still thinking about her lung going to kill"  Follow-up in the emergency room for this 61 year old man with substance abuse and antisocial personality. Reevaluated today he is still talking about how he is thinking of being suicidal and homicidal in an indiscriminate sort of way although he does mention that there is a particular woman in [place] who he would like to kill. Patient is showing no symptoms of depression no symptoms of psychosis no symptoms of mania. Has been mostly withdrawn to his bed all day today.  HPI:  Patient interviewed chart reviewed. Old notes from outside hospitals reviewed. 61 year old man was in the emergency room today for chest pain. He had apparently come to residential treatment services from [facility] just yesterday but then impulsively left there and came to the emergency room. When his medical problems here were cleared he was discharged to the waiting area. He was asking if he could be given a free ticket to take him on the bus back to [place]. When he was told that this could not be provided the patient announced that he had suddenly begun to have auditory hallucinations and was thinking of killing himself or killing someone else. When I spoke to him this evening he repeated all of those things. He says that people are playing "mind games" with him. His description of mind games is when people  won't give him what he wants and when he wants it. He says that he was asking for a bus ticket and was told it would not be provided which she considers to be an inappropriate response. He claims he has never had these symptoms before which is contradicted by the chart. He's had an admission just about a year ago and [facility] pretty much exactly the same situation and was diagnosed with malingering at that time. Patient admits that he's been abusing cocaine which is his primary drug of choice and is the reason that he is here but also has been abusing alcohol. Claims that he drinks 740 ounces per day.  Social history: Originally from [place] he has been in [place] for a few years mostly in [place]. Says that he has a place to stay only when his girlfriend is not acting crazy.  Medical history: Does have a history of myocardial infarction and coronary artery disease  Substance abuse history: History of alcohol and cocaine abuse  Past Psychiatric History: Patient claimed to me that he had never been treated for any mental health problems before. That appears to be not the case he was in the hospital with similar complaints and year ago. Denies any past history of suicide attempts. Denies any past psychiatric medicine.  Risk to Self: Suicidal Ideation: Yes-Currently Present Suicidal Intent: No Is patient at risk for suicide?: No Suicidal Plan?: Yes-Currently Present Specify Current Suicidal Plan: Overdose or walk into traffic Access to Means: Yes Specify Access to Suicidal Means: Medications and traffic What has been your use  of drugs/alcohol within the last 12 months?: Alcohol How many times?: 0 Other Self Harm Risks: Active Addiction Triggers for Past Attempts: None known Intentional Self Injurious Behavior: None Risk to Others: Homicidal Ideation: No Thoughts of Harm to Others: No Current Homicidal Intent: No Current Homicidal Plan: No Access to Homicidal Means:  No Identified Victim: Reports of none History of harm to others?: No Violent Behavior Description: Reports of none Does patient have access to weapons?: No Criminal Charges Pending?: No Does patient have a court date: No Prior Inpatient Therapy: Prior Inpatient Therapy: Yes Prior Therapy Dates: Patient unable to remember dates Prior Therapy Facilty/Provider(s): Patient unable to remember name (Was in [facility] area) Reason for Treatment: Depression and substance use Prior Outpatient Therapy: Prior Outpatient Therapy: No Prior Therapy Dates: Reports of none Prior Therapy Facilty/Provider(s): Reports of none Reason for Treatment: Reports of none Does patient have an ACCT team?: No Does patient have Intensive In-House Services?  : No Does patient have [facility] services? : No Does patient have P4CC services?: No  Past Medical History:  Past Medical History:  Diagnosis Date  . Diabetes mellitus without complication (HCC)   . MI, old     Past Surgical History:  Procedure Laterality Date  . CORONARY ANGIOPLASTY WITH STENT PLACEMENT     Family History: No family history on file. Family Psychiatric  History: Denies knowing of any family history Social History:  History  Alcohol Use  . Yes    Comment: 6-8 40oz per day     History  Drug use: Unknown    Social History   Social History  . Marital status: Single    Spouse name: N/A  . Number of children: N/A  . Years of education: N/A   Social History Main Topics  . Smoking status: Current Every Day Smoker    Packs/day: 0.10    Types: Cigarettes  . Smokeless tobacco: None  . Alcohol use Yes     Comment: 6-8 40oz per day  . Drug use: Unknown  . Sexual activity: Not Asked   Other Topics Concern  . None   Social History Narrative  . None   Additional Social History:    Allergies:   Allergies  Allergen Reactions  . Tape Itching  . Latex Rash  . Motrin [Ibuprofen] Rash  . Tylenol [Acetaminophen] Rash     Labs:  Results for orders placed or performed during the hospital encounter of 12/01/16 (from the past 48 hour(s))  Ethanol     Status: None   Collection Time: 12/01/16  2:51 PM  Result Value Ref Range   Alcohol, Ethyl (B) <5 <5 mg/dL    Comment:        LOWEST DETECTABLE LIMIT FOR SERUM ALCOHOL IS 5 mg/dL FOR MEDICAL PURPOSES ONLY   Urine Drug Screen, Qualitative ([facility] only)     Status: Abnormal   Collection Time: 12/01/16  2:51 PM  Result Value Ref Range   Tricyclic, Ur Screen NONE DETECTED NONE DETECTED   Amphetamines, Ur Screen NONE DETECTED NONE DETECTED   MDMA (Ecstasy)Ur Screen NONE DETECTED NONE DETECTED   Cocaine Metabolite,Ur [place] NONE DETECTED NONE DETECTED   Opiate, Ur Screen POSITIVE (A) NONE DETECTED   Phencyclidine (PCP) Ur S NONE DETECTED NONE DETECTED   Cannabinoid 50 Ng, Ur [place] NONE DETECTED NONE DETECTED   Barbiturates, Ur Screen NONE DETECTED NONE DETECTED   Benzodiazepine, Ur Scrn POSITIVE (A) NONE DETECTED   Methadone Scn, Ur NONE DETECTED NONE DETECTED  Comment: (NOTE) 100  Tricyclics, urine               Cutoff 1000 ng/mL 200  Amphetamines, urine             Cutoff 1000 ng/mL 300  MDMA (Ecstasy), urine           Cutoff 500 ng/mL 400  Cocaine Metabolite, urine       Cutoff 300 ng/mL 500  Opiate, urine                   Cutoff 300 ng/mL 600  Phencyclidine (PCP), urine      Cutoff 25 ng/mL 700  Cannabinoid, urine              Cutoff 50 ng/mL 800  Barbiturates, urine             Cutoff 200 ng/mL 900  Benzodiazepine, urine           Cutoff 200 ng/mL 1000 Methadone, urine                Cutoff 300 ng/mL 1100 1200 The urine drug screen provides only a preliminary, unconfirmed 1300 analytical test result and should not be used for non-medical 1400 purposes. Clinical consideration and professional judgment should 1500 be applied to any positive drug screen result due to possible 1600 interfering substances. A more specific alternate chemical method 1700  must be used in order to obtain a confirmed analytical result.  1800 Gas chromato graphy / mass spectrometry (GC/MS) is the preferred 1900 confirmatory method.   Glucose, capillary     Status: Abnormal   Collection Time: 12/01/16  9:51 PM  Result Value Ref Range   Glucose-Capillary 164 (H) 65 - 99 mg/dL    Current Facility-Administered Medications  Medication Dose Route Frequency Provider Last Rate Last Dose  . amLODipine (NORVASC) tablet 10 mg  10 mg Oral Daily Myrna Blazer, MD   10 mg at 12/02/16 1610  . aspirin EC tablet 81 mg  81 mg Oral Daily Myrna Blazer, MD   81 mg at 12/02/16 0953  . atorvastatin (LIPITOR) tablet 80 mg  80 mg Oral Daily Myrna Blazer, MD   80 mg at 12/02/16 0953  . busPIRone (BUSPAR) tablet 5 mg  5 mg Oral BID Myrna Blazer, MD   5 mg at 12/02/16 9604  . hydrochlorothiazide (HYDRODIURIL) tablet 25 mg  25 mg Oral Daily Myrna Blazer, MD   25 mg at 12/02/16 5409  . LORazepam (ATIVAN) tablet 0-4 mg  0-4 mg Oral Q6H Myrna Blazer, MD       Followed by  . [START ON 12/03/2016] LORazepam (ATIVAN) tablet 0-4 mg  0-4 mg Oral Q12H Myrna Blazer, MD      . metFORMIN (GLUCOPHAGE-XR) 24 hr tablet 500 mg  500 mg Oral Q breakfast Myrna Blazer, MD   500 mg at 12/02/16 0953  . metoprolol (LOPRESSOR) tablet 50 mg  50 mg Oral BID Myrna Blazer, MD   50 mg at 12/02/16 8119  . naproxen (NAPROSYN) tablet 500 mg  500 mg Oral BID WC Myrna Blazer, MD   500 mg at 12/02/16 0953  . thiamine (VITAMIN B-1) tablet 100 mg  100 mg Oral Daily Myrna Blazer, MD   100 mg at 12/02/16 1478   Or  . thiamine (B-1) injection 100 mg  100 mg Intravenous Daily Myrna Blazer, MD      .  traZODone (DESYREL) tablet 150 mg  150 mg Oral QHS Myrna Blazer, MD   150 mg at 12/01/16 2155   Current Outpatient Prescriptions  Medication Sig Dispense Refill  . amLODipine (NORVASC) 10 MG  tablet Take 10 mg by mouth daily.    Marland Kitchen aspirin EC 81 MG tablet Take 81 mg by mouth daily.    Marland Kitchen atorvastatin (LIPITOR) 80 MG tablet Take 80 mg by mouth daily.    . busPIRone (BUSPAR) 5 MG tablet Take 5 mg by mouth 2 (two) times daily.    . hydrochlorothiazide (HYDRODIURIL) 25 MG tablet Take 25 mg by mouth daily.    . metFORMIN (GLUCOPHAGE-XR) 500 MG 24 hr tablet Take 500 mg by mouth daily with breakfast.    . metoprolol (LOPRESSOR) 50 MG tablet Take 50 mg by mouth 2 (two) times daily.    . naproxen (NAPROSYN) 250 MG tablet Take 500 mg by mouth 2 (two) times daily with a meal.    . traZODone (DESYREL) 150 MG tablet Take 150 mg by mouth at bedtime.      Musculoskeletal: Strength & Muscle Tone: within normal limits Gait & Station: normal Patient leans: N/A  Psychiatric Specialty Exam: Physical Exam  Nursing note and vitals reviewed. Constitutional: He appears well-developed and well-nourished.  HENT:  Head: Normocephalic and atraumatic.  Eyes: Conjunctivae are normal. Pupils are equal, round, and reactive to light.  Neck: Normal range of motion.  Cardiovascular: Regular rhythm and normal heart sounds.   Respiratory: Effort normal. No respiratory distress.  GI: Soft.  Musculoskeletal: Normal range of motion.  Neurological: He is alert.  Skin: Skin is warm and dry.  Psychiatric: His speech is normal and behavior is normal. His affect is angry, labile and inappropriate. He expresses impulsivity and inappropriate judgment. He expresses homicidal and suicidal ideation. He exhibits abnormal recent memory and abnormal remote memory.    Review of Systems  Constitutional: Negative.   HENT: Negative.   Eyes: Negative.   Respiratory: Negative.   Cardiovascular: Negative.   Gastrointestinal: Negative.   Musculoskeletal: Negative.   Skin: Negative.   Neurological: Negative.   Psychiatric/Behavioral: Positive for depression, hallucinations, substance abuse and suicidal ideas. Negative for  memory loss. The patient is nervous/anxious and has insomnia.     Blood pressure 110/76, pulse 79, temperature 98.4 F (36.9 C), temperature source Oral, resp. rate 15, height 5\' 11"  (1.803 m), weight 104.3 kg (230 lb), SpO2 97 %.Body mass index is 32.08 kg/m.  General Appearance: Fairly Groomed  Eye Contact:  Fair  Speech:  Clear and Coherent  Volume:  Normal  Mood:  Irritable  Affect:  Congruent  Thought Process:  Goal Directed  Orientation:  Full (Time, Place, and Person)  Thought Content:  Tangential  Suicidal Thoughts:  Yes.  with intent/plan  Homicidal Thoughts:  Yes.  with intent/plan  Memory:  Immediate;   Fair Recent;   Fair Remote;   Fair  Judgement:  Impaired  Insight:  Shallow  Psychomotor Activity:  Decreased  Concentration:  Concentration: Fair  Recall:  [employer] of Knowledge:  Fair  Language:  Fair  Akathisia:  No  Handed:  Right  AIMS (if indicated):     Assets:  Communication Skills  ADL's:  Intact  Cognition:  WNL  Sleep:        Treatment Plan Summary: Plan Antisocial personality disorder continues to malinger but still his history of behavior probably suggests he is at chronic high risk of dangerousness. No indication for  inpatient hospitalization. Doesn't appear to be cooperative and safe enough to feel comfortable seeing him out in the community. Continue monitoring reevaluate tomorrow.  Disposition: Supportive therapy provided about ongoing stressors.  Mordecai RasmussenJohn Clapacs, MD 12/02/2016 6:24 PM

## 2016-12-02 NOTE — ED Notes (Signed)
Pt ate breakfast and cut light off and went back to bed.

## 2016-12-02 NOTE — ED Notes (Signed)
Pt given extra food tray.

## 2016-12-02 NOTE — ED Notes (Signed)
When ask pt if he wanted to take a shower pt stated he was fine and he would wait until tomorrow.

## 2016-12-02 NOTE — ED Provider Notes (Signed)
-----------------------------------------   2:27 PM on 12/02/2016 -----------------------------------------   Blood pressure 110/76, pulse 79, temperature 98.4 F (36.9 C), temperature source Oral, resp. rate 15, height 5\' 11"  (1.803 m), weight 230 lb (104.3 kg), SpO2 97 %.  The patient had no acute events since last update.  Calm and cooperative at this time.  Disposition is pending Psychiatry/Behavioral Medicine team recommendations.     Merrily BrittleNeil Adrinne Sze, MD 12/02/16 [phone]

## 2016-12-02 NOTE — ED Notes (Signed)
Patient is sleeping, no signs of distress, Patient with q 15 minute checks and camera surveillance in progress.

## 2016-12-03 NOTE — ED Notes (Signed)
Pt earlier did call [facility] himself and did a phone interview

## 2016-12-03 NOTE — BH Assessment (Addendum)
Patient has been accepted to [facility].  Patient assigned to their substance abuse program. Accepting physician is Dr. Darrol Jumpedd.  Call report to [phone].  Representative was [employer]an Davis.  Patient need to be a the facility by 9:00pm or he will lose the bed.   Patient will need transportation to the facility due to been voluntary. Patient will need to remain voluntary in order to be admitted to there program.   Writer spoke with [facility] BMU Director Sheralyn Boatman(Toni) to see if [facility] [employer] [facility] [employer] was an option, was advised to talk with [facility]. Per her understanding the contract is for within system.Scientist, research (medical).  Writer spoke with [facility] In[facility] Fermo(Tina) and was [facility] and was told, that it was too far and the bill will not be paid by [facility]. Thus, was instructed talk with [facility] ER to see if they were okay with the transport.   Writer spoke with [facility] BM[facility] Director Sheralyn Boatman(Toni) and informed her of the conversation with [facility]. Writer was advised to talk with ER Child psychotherapistsocial Worker.   Spoke with ER Child psychotherapistocial Worker Camak(Monica). Informed her of the bed offer at [facility]. Informed her BMU Social Worker Trenton Founds(Kadijah G.) was willing to help with the disposition and offered the suggestion of [employer] to [place]. The bus ticket from [place] to [place] was approximately $24.00. However, he will need transportation to [place]. She suggested cab due to missing the [employer] Bus to get there from [facility]. The last one has already left. Social Worker Associate Professor(Monica) stated she had to talk with her supervisor to get authorization.  Writer received phone call back from Child psychotherapistocial Worker [facility](Monica) stating her boss stated they will not pay for the bus ticket for him to transport to [place].  Writer updated [facility] BM[facility] Director of conversations that took place to this point.  16:11-Received phone call from ER Social Worker [facility](Monica) asking whether the bed at [facility] was for detox or mental health. Writer informed her it was for dual  diagnosis. She stated, her supervisor Timothy Lasso(Zack) will follow up with writer's supervisor Sheralyn Boatman(Toni).  17:01-Spoke with Social Worker [facility](Monica), she stated she was off the case and her supervisor Product/process development scientist(Zack) was working on it. He did not feel comfortable with patient transporting on the bus due to what was written in ER notes.  17:43-Received phone call from Child psychotherapistocial Worker, stating her supervisor said he can transport via [employer]. Writer shared it was the understanding that [facility] was contracted for in system and will need approval from social work department because it was coming form their department because of the disposition. Social worker contacted her supervisor and Research officer, political partycalled writer back and stated, the contract is for the hospital and it wasn't from a specific department and that he could transport via [facility].  Writer informed ER staff (Dr. Don PerkingVeronese, MD & Rivka BarbaraGlenda, ER Kathrynn SpeedSectary) of the acceptance.  Writer contacted [facility] ([phone]) and they will be at the ER within the 30 minutes. Writer updated patient's nurse.

## 2016-12-03 NOTE — ED Notes (Signed)
Pt picked up by [facility] ,instructions given to driver

## 2016-12-03 NOTE — Consult Note (Signed)
[facility]   Reason for Consult:  Consult for 61 year old man who is in the emergency room insisting that he is suicidal Referring Physician:  Pershing ProudSchaevitz Patient Identification: Victor Herring MRN:  [id number] Principal Diagnosis: Malingering Diagnosis:   Patient Active Problem List   Diagnosis Date Noted  . Cocaine abuse [F14.10] 12/01/2016  . Malingering [Z76.5] 12/01/2016  . Antisocial personality disorder [F60.2] 12/01/2016    Total Time spent with patient: 20 minutes  Subjective:   Victor Herring is a 61 y.o. male patient admitted with"Am still thinking about her lung going to kill"  Follow-up for this 61 year old man with substance abuse and personality disorder behavior. Patient has been told that a facility in [place] is willing to give him a bed offer and accept him. Knowing that his says his mood is feeling better. Now denies any acute suicidal or homicidal intent or plan. Affect is more upbeat. Taken him off commitment. Working on disposition.  HPI:  Patient interviewed chart reviewed. Old notes from outside hospitals reviewed. 61 year old man was in the emergency room today for chest pain. He had apparently come to residential treatment services from [place] just yesterday but then impulsively left there and came to the emergency room. When his medical problems here were cleared he was discharged to the waiting area. He was asking if he could be given a free ticket to take him on the bus back to [place]. When he was told that this could not be provided the patient announced that he had suddenly begun to have auditory hallucinations and was thinking of killing himself or killing someone else. When I spoke to him this evening he repeated all of those things. He says that people are playing "mind games" with him. His description of mind games is when people won't give him what he wants and when he wants it. He says that he was asking for a bus ticket and was  told it would not be provided which she considers to be an inappropriate response. He claims he has never had these symptoms before which is contradicted by the chart. He's had an admission just about a year ago and [facility] [facility] pretty much exactly the same situation and was diagnosed with malingering at that time. Patient admits that he's been abusing cocaine which is his primary drug of choice and is the reason that he is here but also has been abusing alcohol. Claims that he drinks 740 ounces per day.  Social history: Originally from [place] he has been in [place]  for a few years mostly in [place]. Says that he has a place to stay only when his girlfriend is not acting crazy.  Medical history: Does have a history of myocardial infarction and coronary artery disease  Substance abuse history: History of alcohol and cocaine abuse  Past Psychiatric History: Patient claimed to me that he had never been treated for any mental health problems before. That appears to be not the case he was in the hospital with similar complaints and year ago. Denies any past history of suicide attempts. Denies any past psychiatric medicine.  Risk to Self: Suicidal Ideation: Yes-Currently Present Suicidal Intent: No Is patient at risk for suicide?: No Suicidal Plan?: Yes-Currently Present Specify Current Suicidal Plan: Overdose or walk into traffic Access to Means: Yes Specify Access to Suicidal Means: Medications and traffic What has been your use of drugs/alcohol within the last 12 months?: Alcohol How many times?: 0 Other Self Harm Risks: Active Addiction  Triggers for Past Attempts: None known Intentional Self Injurious Behavior: None Risk to Others: Homicidal Ideation: No Thoughts of Harm to Others: No Current Homicidal Intent: No Current Homicidal Plan: No Access to Homicidal Means: No Identified Victim: Reports of none History of harm to others?: No Violent Behavior Description: Reports  of none Does patient have access to weapons?: No Criminal Charges Pending?: No Does patient have a court date: No Prior Inpatient Therapy: Prior Inpatient Therapy: Yes Prior Therapy Dates: Patient unable to remember dates Prior Therapy Facilty/Provider(s): Patient unable to remember name (Was in [facility] area) Reason for Treatment: Depression and substance use Prior Outpatient Therapy: Prior Outpatient Therapy: No Prior Therapy Dates: Reports of none Prior Therapy Facilty/Provider(s): Reports of none Reason for Treatment: Reports of none Does patient have an ACCT team?: No Does patient have Intensive In-House Services?  : No Does patient have [facility] services? : No Does patient have P4CC services?: No  Past Medical History:  Past Medical History:  Diagnosis Date  . Diabetes mellitus without complication (HCC)   . MI, old     Past Surgical History:  Procedure Laterality Date  . CORONARY ANGIOPLASTY WITH STENT PLACEMENT     Family History: No family history on file. Family Psychiatric  History: Denies knowing of any family history Social History:  History  Alcohol Use  . Yes    Comment: 6-8 40oz per day     History  Drug use: Unknown    Social History   Social History  . Marital status: Single    Spouse name: N/A  . Number of children: N/A  . Years of education: N/A   Social History Main Topics  . Smoking status: Current Every Day Smoker    Packs/day: 0.10    Types: Cigarettes  . Smokeless tobacco: None  . Alcohol use Yes     Comment: 6-8 40oz per day  . Drug use: Unknown  . Sexual activity: Not Asked   Other Topics Concern  . None   Social History Narrative  . None   Additional Social History:    Allergies:   Allergies  Allergen Reactions  . Tape Itching  . Latex Rash  . Motrin [Ibuprofen] Rash  . Tylenol [Acetaminophen] Rash    Labs:  Results for orders placed or performed during the hospital encounter of 12/01/16 (from the past  48 hour(s))  Glucose, capillary     Status: Abnormal   Collection Time: 12/01/16  9:51 PM  Result Value Ref Range   Glucose-Capillary 164 (H) 65 - 99 mg/dL    Current Facility-Administered Medications  Medication Dose Route Frequency Provider Last Rate Last Dose  . amLODipine (NORVASC) tablet 10 mg  10 mg Oral Daily Myrna Blazer, MD   10 mg at 12/03/16 0935  . aspirin EC tablet 81 mg  81 mg Oral Daily Myrna Blazer, MD   81 mg at 12/03/16 0934  . atorvastatin (LIPITOR) tablet 80 mg  80 mg Oral Daily Myrna Blazer, MD   80 mg at 12/03/16 0955  . busPIRone (BUSPAR) tablet 5 mg  5 mg Oral BID Myrna Blazer, MD   5 mg at 12/03/16 0932  . hydrochlorothiazide (HYDRODIURIL) tablet 25 mg  25 mg Oral Daily Myrna Blazer, MD   25 mg at 12/03/16 0935  . LORazepam (ATIVAN) tablet 0-4 mg  0-4 mg Oral Q6H Myrna Blazer, MD       Followed by  . LORazepam (ATIVAN) tablet 0-4  mg  0-4 mg Oral Q12H Myrna Blazer, MD      . metFORMIN (GLUCOPHAGE-XR) 24 hr tablet 500 mg  500 mg Oral Q breakfast Myrna Blazer, MD   500 mg at 12/03/16 0935  . metoprolol (LOPRESSOR) tablet 50 mg  50 mg Oral BID Myrna Blazer, MD   50 mg at 12/03/16 0933  . naproxen (NAPROSYN) tablet 500 mg  500 mg Oral BID WC Myrna Blazer, MD   500 mg at 12/03/16 0956  . thiamine (VITAMIN B-1) tablet 100 mg  100 mg Oral Daily Myrna Blazer, MD   100 mg at 12/03/16 8295   Or  . thiamine (B-1) injection 100 mg  100 mg Intravenous Daily Myrna Blazer, MD      . traZODone (DESYREL) tablet 150 mg  150 mg Oral QHS Myrna Blazer, MD   150 mg at 12/02/16 2117   Current Outpatient Prescriptions  Medication Sig Dispense Refill  . amLODipine (NORVASC) 10 MG tablet Take 10 mg by mouth daily.    Marland Kitchen aspirin EC 81 MG tablet Take 81 mg by mouth daily.    Marland Kitchen atorvastatin (LIPITOR) 80 MG tablet Take 80 mg by mouth daily.    .  busPIRone (BUSPAR) 5 MG tablet Take 5 mg by mouth 2 (two) times daily.    . hydrochlorothiazide (HYDRODIURIL) 25 MG tablet Take 25 mg by mouth daily.    . metFORMIN (GLUCOPHAGE-XR) 500 MG 24 hr tablet Take 500 mg by mouth daily with breakfast.    . metoprolol (LOPRESSOR) 50 MG tablet Take 50 mg by mouth 2 (two) times daily.    . naproxen (NAPROSYN) 250 MG tablet Take 500 mg by mouth 2 (two) times daily with a meal.    . traZODone (DESYREL) 150 MG tablet Take 150 mg by mouth at bedtime.      Musculoskeletal: Strength & Muscle Tone: within normal limits Gait & Station: normal Patient leans: N/A  Psychiatric Specialty Exam: Physical Exam  Nursing note and vitals reviewed. Constitutional: He appears well-developed and well-nourished.  HENT:  Head: Normocephalic and atraumatic.  Eyes: Conjunctivae are normal. Pupils are equal, round, and reactive to light.  Neck: Normal range of motion.  Cardiovascular: Regular rhythm and normal heart sounds.   Respiratory: Effort normal. No respiratory distress.  GI: Soft.  Musculoskeletal: Normal range of motion.  Neurological: He is alert.  Skin: Skin is warm and dry.  Psychiatric: His speech is normal and behavior is normal. His affect is not angry, not labile and not inappropriate. He expresses impulsivity. He does not express inappropriate judgment. He expresses homicidal and suicidal ideation. He exhibits normal recent memory and normal remote memory.    Review of Systems  Constitutional: Negative.   HENT: Negative.   Eyes: Negative.   Respiratory: Negative.   Cardiovascular: Negative.   Gastrointestinal: Negative.   Musculoskeletal: Negative.   Skin: Negative.   Neurological: Negative.   Psychiatric/Behavioral: Positive for hallucinations. Negative for depression, memory loss, substance abuse and suicidal ideas. The patient is nervous/anxious and has insomnia.     Blood pressure (!) 142/85, pulse 65, temperature 98 F (36.7 C),  temperature source Oral, resp. rate 18, height 5\' 11"  (1.803 m), weight 104.3 kg (230 lb), SpO2 97 %.Body mass index is 32.08 kg/m.  General Appearance: Fairly Groomed  Eye Contact:  Fair  Speech:  Clear and Coherent  Volume:  Normal  Mood:  Irritable  Affect:  Congruent  Thought Process:  Goal  Directed  Orientation:  Full (Time, Place, and Person)  Thought Content:  Tangential  Suicidal Thoughts:  Yes.  with intent/plan  Homicidal Thoughts:  Yes.  with intent/plan  Memory:  Immediate;   Fair Recent;   Fair Remote;   Fair  Judgement:  Impaired  Insight:  Shallow  Psychomotor Activity:  Decreased  Concentration:  Concentration: Fair  Recall:  [employer] of Knowledge:  Fair  Language:  Fair  Akathisia:  No  Handed:  Right  AIMS (if indicated):     Assets:  Communication Skills  ADL's:  Intact  Cognition:  WNL  Sleep:        Treatment Plan Summary: Plan Unclear how this patient is going to get to the [place] area. Social work and TTS working on it. Discontinued IVC as that was a condition for acceptance to the other hospital. Patient however will remain in the emergency room for observation until we have a clear disposition plan to put into place. No changed any medication or treatment.  Disposition: Supportive therapy provided about ongoing stressors.  Mordecai Rasmussen, MD 12/03/2016 3:19 PM

## 2016-12-03 NOTE — ED Notes (Signed)
Will exlplain to driver that they are to take pt to the emergency room and they will register him and take him to the unit.if any questions call [phone]

## 2016-12-03 NOTE — ED Notes (Signed)
Pt stated he has a bed at at detox center in [place] called [facility] and I called and spoke to a Tamika who confirms he has a bed if he gets there by 9 pm,pt then started asking for us to provide transportation, I explained to him that he was not discharged and still under ivc, Will notify tts

## 2016-12-03 NOTE — ED Provider Notes (Signed)
-----------------------------------------   7:27 AM on 12/03/2016 -----------------------------------------   Blood pressure 129/88, pulse 72, temperature 98 F (36.7 C), temperature source Oral, resp. rate 18, height 5\' 11"  (1.803 m), weight 230 lb (104.3 kg), SpO2 97 %.  The patient had no acute events since last update.  Calm and cooperative at this time.  Disposition is pending Psychiatry/Behavioral Medicine team recommendations.     Jennye MoccasinBrian S Tamarick Kovalcik, MD 12/03/16 [id number]

## 2016-12-03 NOTE — ED Notes (Signed)
Pt using phone  

## 2016-12-03 NOTE — ED Notes (Signed)
IVC in progress

## 2016-12-03 NOTE — ED Notes (Addendum)
Called [facility] to see if they needed any information they said no, they are just waiting for him I just explained we were still working on transportation  Address is [address] [place] [address]   [phone] talked to Reunionamika and Lonni Fixolan

## 2018-11-12 IMAGING — CR DG CHEST 2V
2 series · 2 of 2 positions shown · non-contrast
Comparison: None.

CLINICAL DATA: 61-year-old diabetic male with left-sided chest pain
and left arm pain since this morning. History of prior myocardial
infarction. Smoker. Initial encounter.

EXAM:
CHEST  2 VIEW

[chest pa]
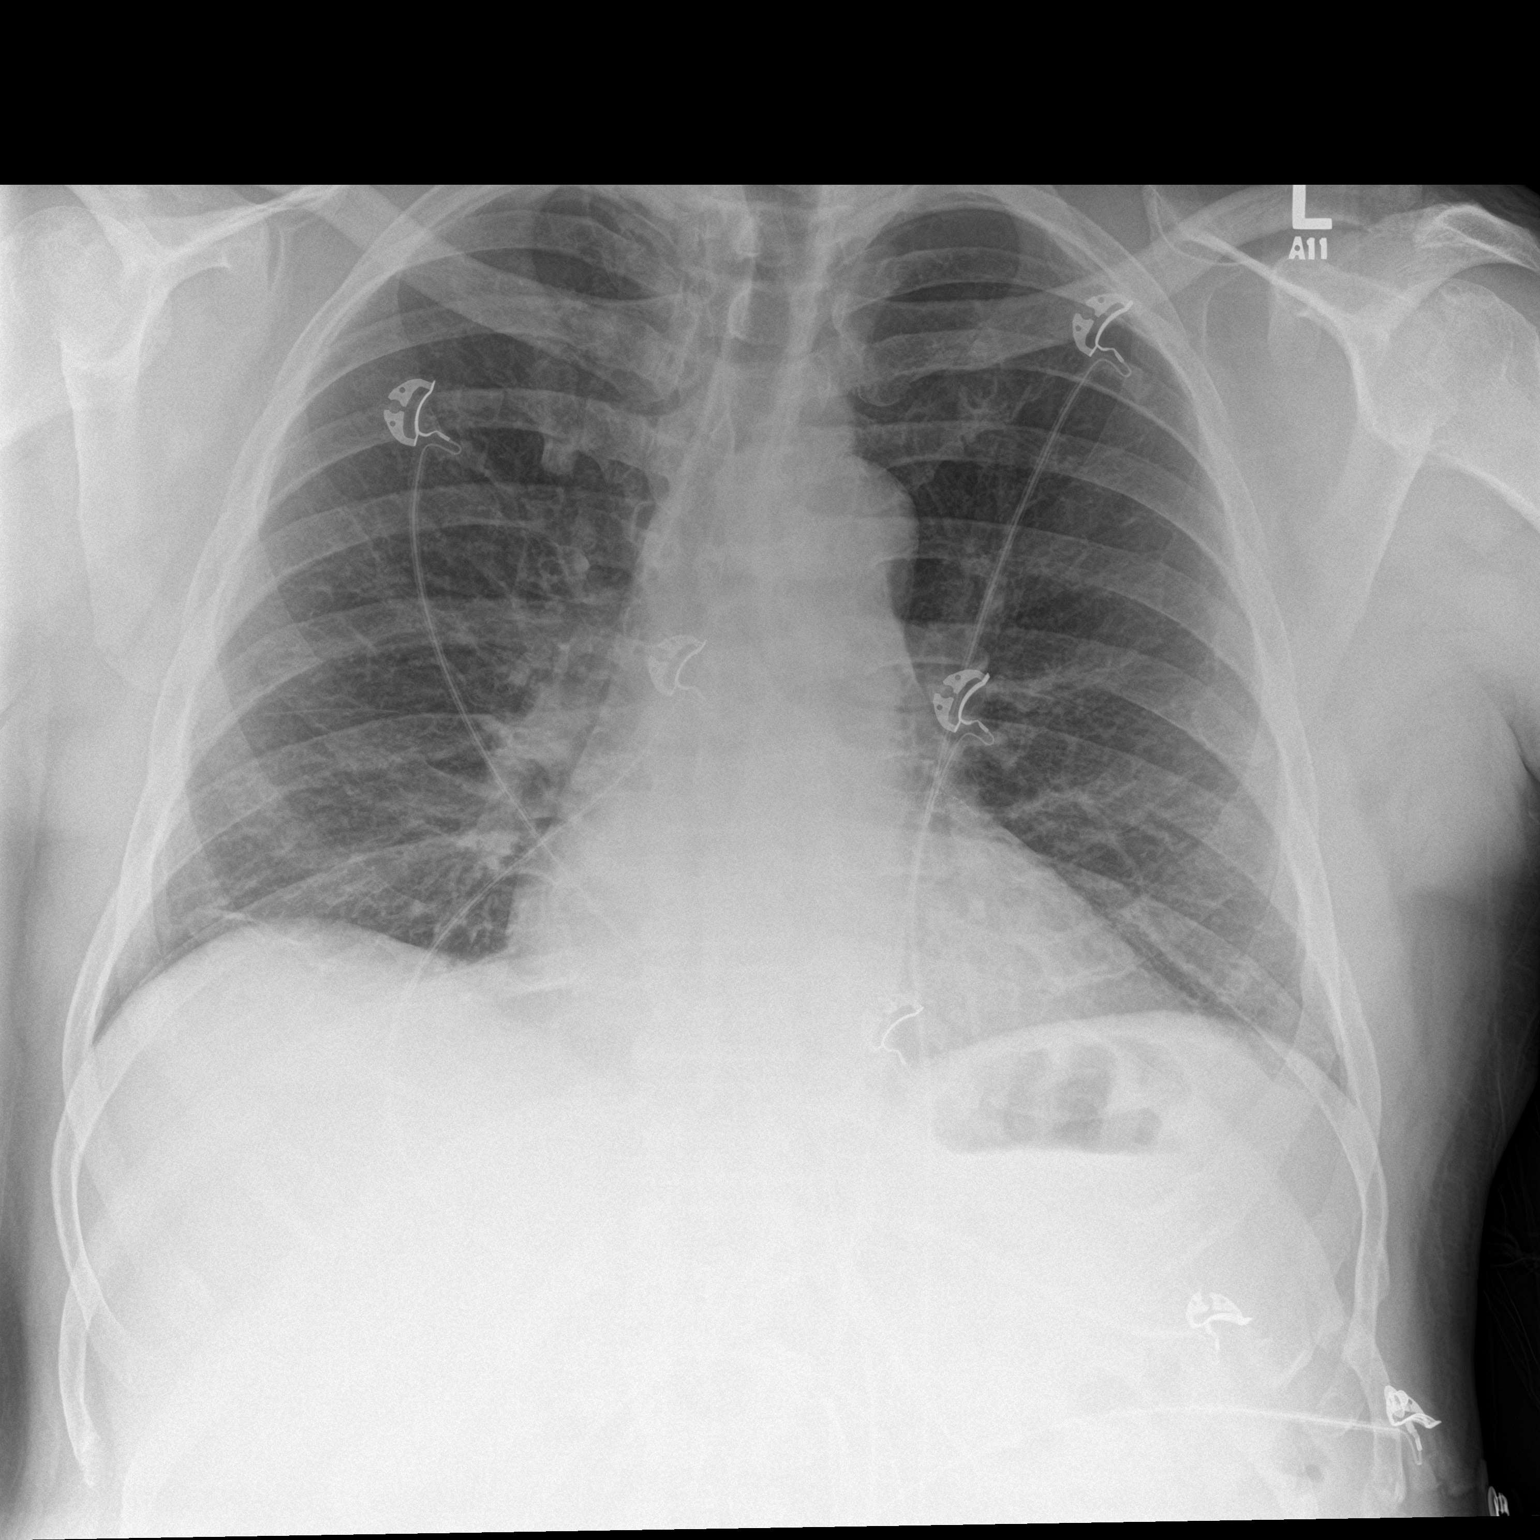

[chest lat]
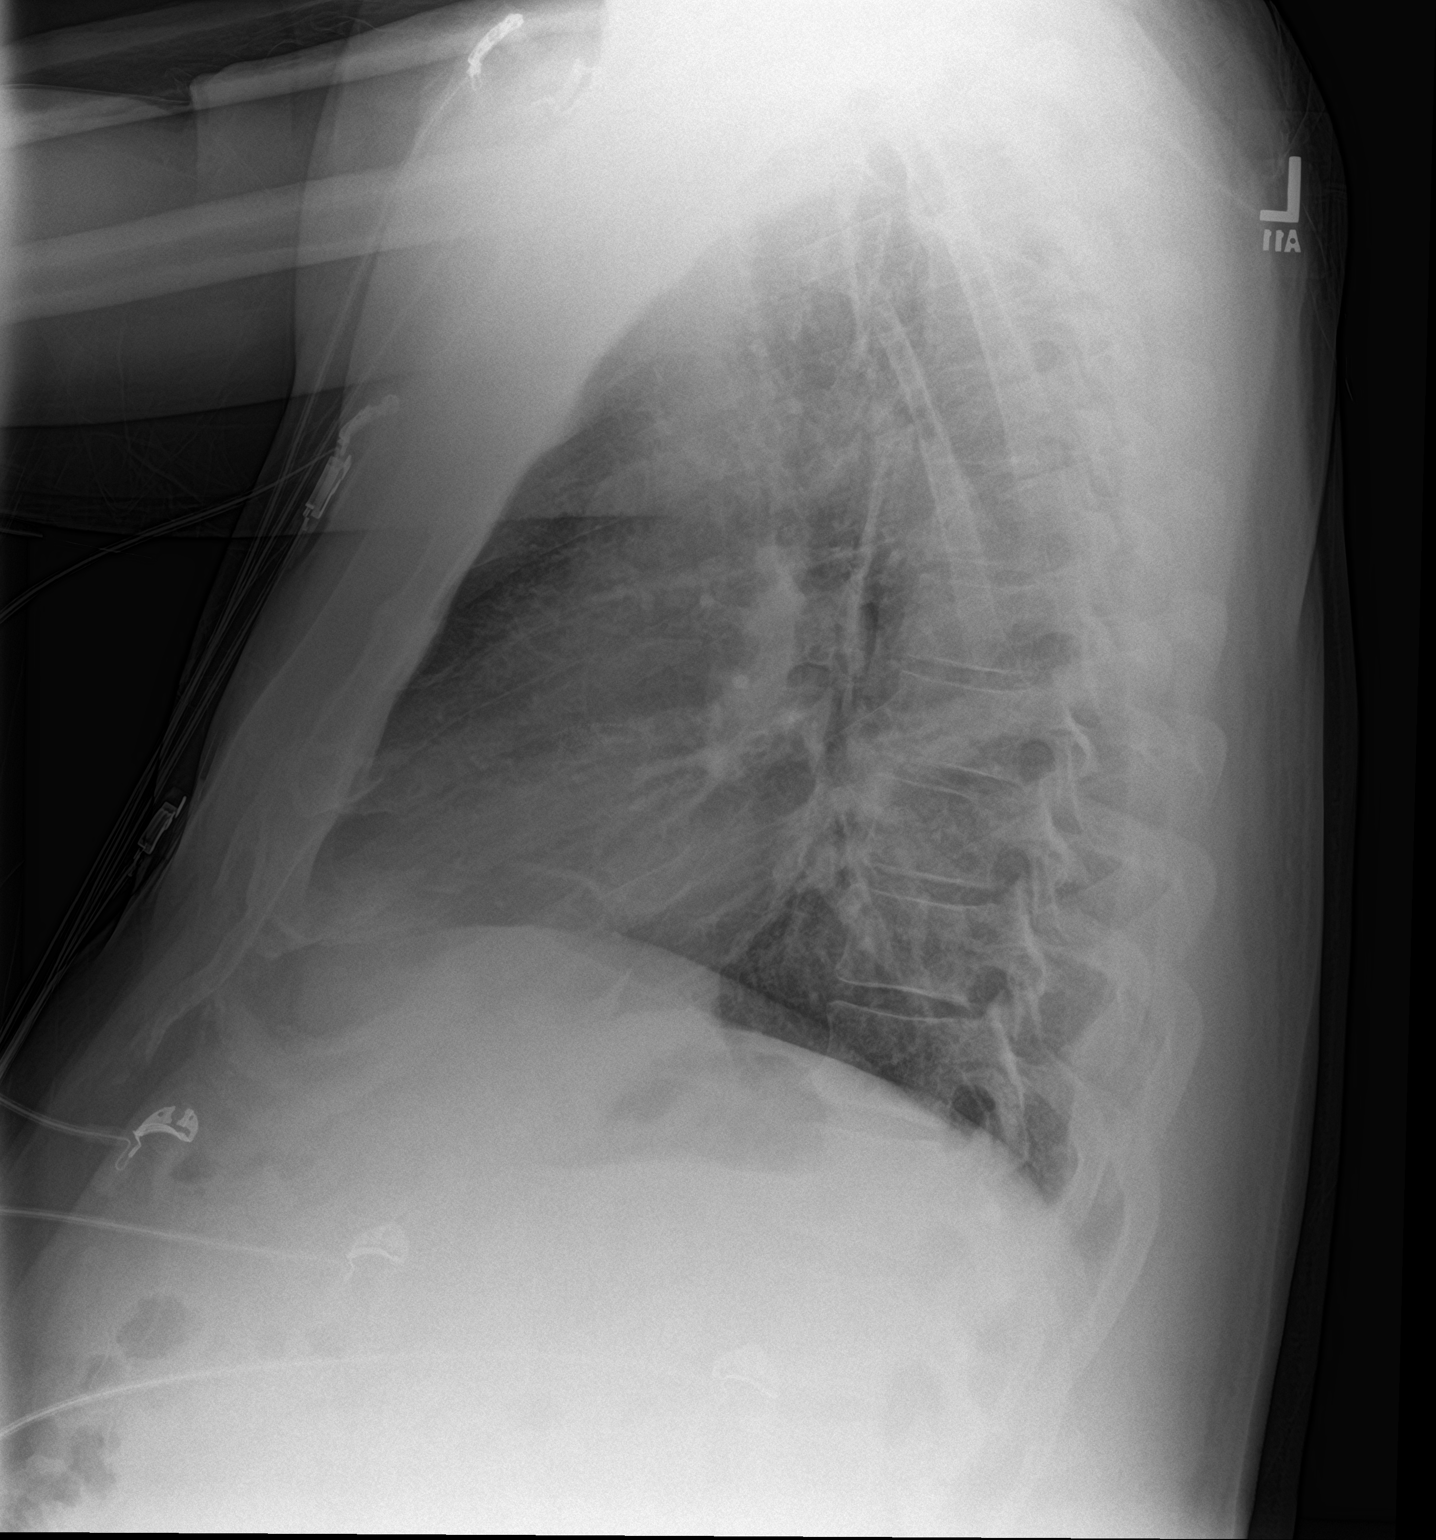

[2 of 2 positions shown; findings below may reference images not displayed]

FINDINGS: Right base subsegmental atelectasis.

Central pulmonary vascular prominence without pulmonary edema.

No segmental consolidation or pneumothorax.

Heart size top-normal.

No plain film evidence of pulmonary malignancy.

Slightly sclerotic appearance of the humeral heads bilaterally.
Cannot exclude bone infarct or avascular necrosis.
IMPRESSION: Right base subsegmental atelectasis.

Central pulmonary vascular prominence without pulmonary edema.

Heart size top-normal.

Slightly sclerotic appearance of the humeral heads bilaterally.
Cannot exclude bone infarct or avascular necrosis.
